# Patient Record
Sex: Male | Born: 1995 | Race: White | Hispanic: No | Marital: Single | Smoking: Never smoker
Health system: Southern US, Community
[De-identification: ages and names within clinical notes are randomized; demographics above are authoritative.]

---
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
## 2018-07-31 ENCOUNTER — Encounter: Payer: Self-pay | Admitting: Sports Medicine

## 2018-08-06 ENCOUNTER — Encounter: Payer: Self-pay | Admitting: Sports Medicine

## 2018-08-30 ENCOUNTER — Encounter: Payer: Self-pay | Admitting: Sports Medicine

## 2018-08-30 ENCOUNTER — Ambulatory Visit (INDEPENDENT_AMBULATORY_CARE_PROVIDER_SITE_OTHER): Payer: No Typology Code available for payment source | Admitting: Sports Medicine

## 2018-08-30 DIAGNOSIS — M25511 Pain in right shoulder: Secondary | ICD-10-CM | POA: Diagnosis not present

## 2018-08-30 DIAGNOSIS — M7551 Bursitis of right shoulder: Secondary | ICD-10-CM | POA: Insufficient documentation

## 2018-08-30 DIAGNOSIS — G8929 Other chronic pain: Secondary | ICD-10-CM

## 2018-08-30 DIAGNOSIS — M7541 Impingement syndrome of right shoulder: Secondary | ICD-10-CM | POA: Diagnosis not present

## 2018-08-30 NOTE — Assessment & Plan Note (Signed)
Greater than 3 months of pain despite conservative measures, negative x-rays, suspect rotator cuff tear. MRI ordered, this will be done on Monday, he can follow-up with me for results. My suspicion is we will do an injection.

## 2018-08-30 NOTE — Progress Notes (Signed)
Subjective:    I'm seeing this patient as a consultation for: Noah Dean  CC: Right shoulder pain  HPI: This is a pleasant 22 year old male, he [facility] [employer], back in August he was doing butterflies, he felt a sharp pain in the front of the shoulder.  [place], x-rays were ordered, and he was treated conservatively as appropriate.  Over the next month he is continued to have pain, localized over the deltoid and worse with overhead activities and abduction.  No mechanical symptoms, no radicular symptoms down to the hand or fingertips.  I reviewed the past medical history, family history, social history, surgical history, and allergies today and no changes were needed.  Please see the problem list section below in epic for further details.  Past Medical History: No past medical history on file. Past Surgical History: No past surgical history on file. Social History: Social History   Socioeconomic History  . Marital status: Single    Spouse name: Not on file  . Number of children: Not on file  . Years of education: Not on file  . Highest education level: Not on file  Occupational History  . Not on file  Social Needs  . Financial resource strain: Not on file  . Food insecurity:    Worry: Not on file    Inability: Not on file  . Transportation needs:    Medical: Not on file    Non-medical: Not on file  Tobacco Use  . Smoking status: Never Smoker  . Smokeless tobacco: Never Used  Substance and Sexual Activity  . Alcohol use: Not on file  . Drug use: Not on file  . Sexual activity: Not on file  Lifestyle  . Physical activity:    Days per week: Not on file    Minutes per session: Not on file  . Stress: Not on file  Relationships  . Social connections:    Talks on phone: Not on file    Gets together: Not on file    Attends religious service: Not on file    Active member of club or organization: Not on file   Attends meetings of clubs or organizations: Not on file    Relationship status: Not on file  Other Topics Concern  . Not on file  Social History Narrative  . Not on file   Family History: Family History  Problem Relation Age of Onset  . Hyperlipidemia Father   . Hypertension Father    Allergies: No Known Allergies Medications: See med rec.  Review of Systems: No headache, visual changes, nausea, vomiting, diarrhea, constipation, dizziness, abdominal pain, skin rash, fevers, chills, night sweats, weight loss, swollen lymph nodes, body aches, joint swelling, muscle aches, chest pain, shortness of breath, mood changes, visual or auditory hallucinations.   Objective:   General: Well Developed, well nourished, and in no acute distress.  Neuro:  Extra-ocular muscles intact, able to move all 4 extremities, sensation grossly intact.  Deep tendon reflexes tested were normal. Psych: Alert and oriented, mood congruent with affect. ENT:  Ears and nose appear unremarkable.  Hearing grossly normal. Neck: Unremarkable overall appearance, trachea midline.  No visible thyroid enlargement. Eyes: Conjunctivae and lids appear unremarkable.  Pupils equal and round. Skin: Warm and dry, no rashes noted.  Cardiovascular: Pulses palpable, no extremity edema. Right shoulder: Inspection reveals no abnormalities, atrophy or asymmetry. Palpation is normal with no tenderness over AC joint or bicipital groove.  ROM is full in all planes. Rotator cuff strength normal throughout. Positive Neer and Hawkin's tests, empty can. Speeds and Yergason's tests normal. No labral pathology noted with negative Obrien's, negative crank, negative clunk, and good stability. Normal scapular function observed. No painful arc and no drop arm sign. No apprehension sign  Impression and Recommendations:   This case required medical decision making of moderate complexity.  Impingement syndrome of right shoulder Greater than 3  months of pain despite conservative measures, negative x-rays, suspect rotator cuff tear. MRI ordered, this will be done on Monday, he can follow-up with me for results. My suspicion is we will do an injection. ___________________________________________ Ihor Austinhomas J. Benjamin Stainhekkekandam, M.D., ABFM., CAQSM. Primary Care and Sports Medicine [facility]  Adjunct Professor of Family Medicine  [employer]

## 2018-09-03 ENCOUNTER — Ambulatory Visit (INDEPENDENT_AMBULATORY_CARE_PROVIDER_SITE_OTHER): Payer: No Typology Code available for payment source

## 2018-09-03 DIAGNOSIS — M7541 Impingement syndrome of right shoulder: Secondary | ICD-10-CM | POA: Diagnosis not present

## 2018-09-03 DIAGNOSIS — G8929 Other chronic pain: Secondary | ICD-10-CM | POA: Diagnosis not present

## 2018-09-03 DIAGNOSIS — M25511 Pain in right shoulder: Secondary | ICD-10-CM | POA: Diagnosis not present

## 2018-09-04 ENCOUNTER — Encounter: Payer: Self-pay | Admitting: Sports Medicine

## 2018-09-04 ENCOUNTER — Ambulatory Visit (INDEPENDENT_AMBULATORY_CARE_PROVIDER_SITE_OTHER): Payer: No Typology Code available for payment source | Admitting: Sports Medicine

## 2018-09-04 DIAGNOSIS — M7551 Bursitis of right shoulder: Secondary | ICD-10-CM | POA: Diagnosis not present

## 2018-09-04 NOTE — Progress Notes (Signed)
Subjective:    CC: MRI results  HPI: This is a pleasant 22 year old male, he had right shoulder pain for some time, MRI results will be dictated below.  I reviewed the past medical history, family history, social history, surgical history, and allergies today and no changes were needed.  Please see the problem list section below in epic for further details.  Past Medical History: No past medical history on file. Past Surgical History: No past surgical history on file. Social History: Social History   Socioeconomic History  . Marital status: Single    Spouse name: Not on file  . Number of children: Not on file  . Years of education: Not on file  . Highest education level: Not on file  Occupational History  . Not on file  Social Needs  . Financial resource strain: Not on file  . Food insecurity:    Worry: Not on file    Inability: Not on file  . Transportation needs:    Medical: Not on file    Non-medical: Not on file  Tobacco Use  . Smoking status: Never Smoker  . Smokeless tobacco: Never Used  Substance and Sexual Activity  . Alcohol use: Not on file  . Drug use: Not on file  . Sexual activity: Not on file  Lifestyle  . Physical activity:    Days per week: Not on file    Minutes per session: Not on file  . Stress: Not on file  Relationships  . Social connections:    Talks on phone: Not on file    Gets together: Not on file    Attends religious service: Not on file    Active member of club or organization: Not on file    Attends meetings of clubs or organizations: Not on file    Relationship status: Not on file  Other Topics Concern  . Not on file  Social History Narrative  . Not on file   Family History: Family History  Problem Relation Age of Onset  . Hyperlipidemia Father   . Hypertension Father    Allergies: No Known Allergies Medications: See med rec.  Review of Systems: No fevers, chills, night sweats, weight loss, chest pain, or shortness of  breath.   Objective:    General: Well Developed, well nourished, and in no acute distress.  Neuro: Alert and oriented x3, extra-ocular muscles intact, sensation grossly intact.  HEENT: Normocephalic, atraumatic, pupils equal round reactive to light, neck supple, no masses, no lymphadenopathy, thyroid nonpalpable.  Skin: Warm and dry, no rashes. Cardiac: Regular rate and rhythm, no murmurs rubs or gallops, no lower extremity edema.  Respiratory: Clear to auscultation bilaterally. Not using accessory muscles, speaking in full sentences.  MRI shows subcoracoid bursitis.  No rotator cuff tears.  Impression and Recommendations:    Subcoracoid bursitis of right shoulder We will start with formal PT, 2 weeks here and then he will [facility]. If no improvement after 4 to 6 weeks we will proceed with a subcoracoid injection. She will focus extensively on adequate form in the gym.  I spent 25 minutes with this patient, greater than 50% was face-to-face time counseling regarding the above diagnoses, specifically discussing imaging findings, as well as biomechanics and anatomy of rotator cuff dysfunction and subcoracoid bursitis. ___________________________________________ Ihor Austinhomas J. Benjamin Stainhekkekandam, M.D., ABFM., CAQSM. Primary Care and Sports Medicine [facility]  Adjunct Professor of Family Medicine  [employer]

## 2018-09-04 NOTE — Assessment & Plan Note (Signed)
We will start with formal PT, 2 weeks here and then he will [facility]. If no improvement after 4 to 6 weeks we will proceed with a subcoracoid injection. She will focus extensively on adequate form in the gym.

## 2018-12-12 ENCOUNTER — Ambulatory Visit (HOSPITAL_COMMUNITY): Payer: Self-pay | Admitting: Psychiatry

## 2019-07-19 IMAGING — MR MR SHOULDER*R* W/O CM
5 series · 40 of 40 positions shown · non-contrast
Comparison: None.

CLINICAL DATA: Right shoulder pain and weakness with painful range
of motion since a lifting injury 5 months ago.

EXAM:
MRI OF THE RIGHT SHOULDER WITHOUT CONTRAST
TECHNIQUE: Multiplanar, multisequence MR imaging of the shoulder was performed.
No intravenous contrast was administered.

[Series 3: PD fat-sat · axial · 4.0mm · 0.59mm/px · z∈[-13,+80]mm · 8 of 22 slices shown (1 of 2)]
[im 1/22]
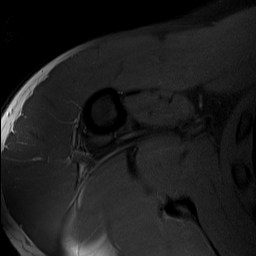
[im 4/22]
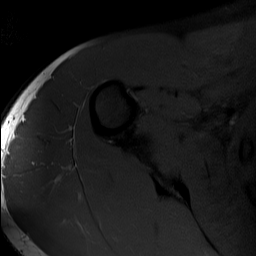
[im 7/22]
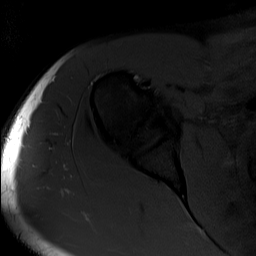
[im 10/22]
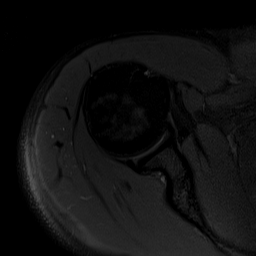
[im 13/22]
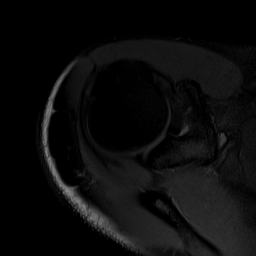
[im 16/22]
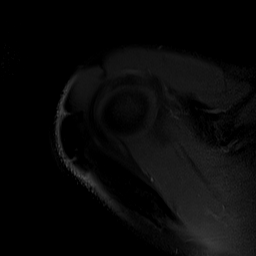
[im 19/22]
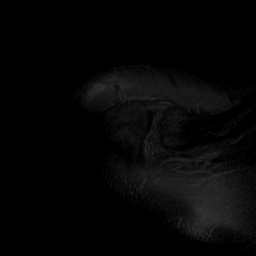
[im 22/22]
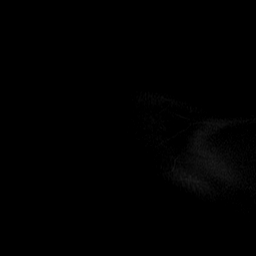

[Series 4: T2 fat-sat · oblique · 4.0mm · 0.59mm/px · 8 of 20 slices shown (1 of 2)]
[im 1/20]
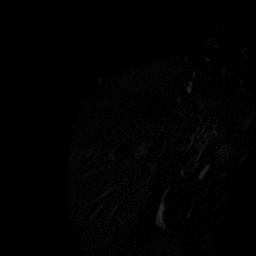
[im 3/20]
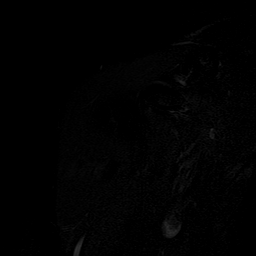
[im 6/20]
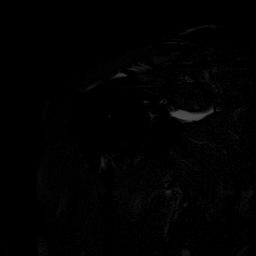
[im 9/20]
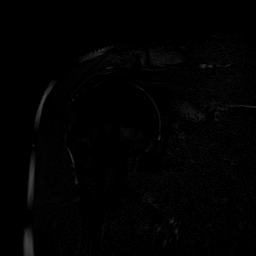
[im 11/20]
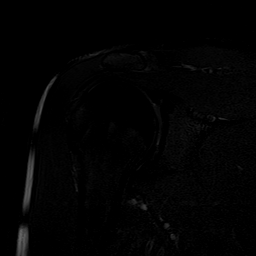
[im 14/20]
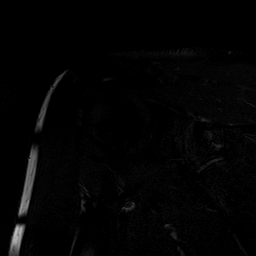
[im 17/20]
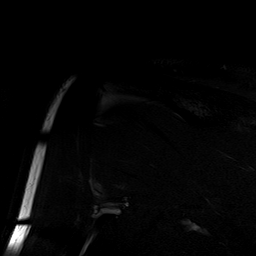
[im 20/20]
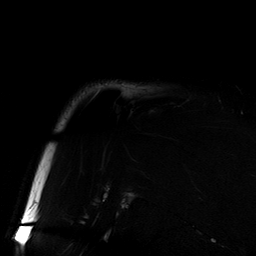

[Series 5: PD fat-sat · oblique · 4.0mm · 0.59mm/px · 8 of 20 slices shown (2 of 2)]
[im 1/20]
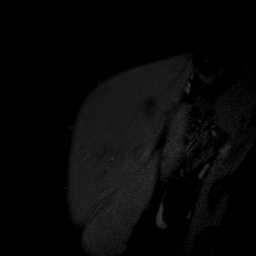
[im 3/20]
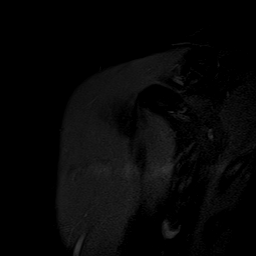
[im 6/20]
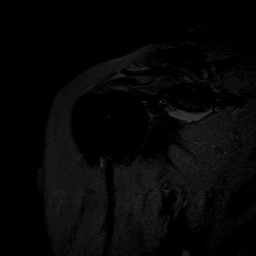
[im 9/20]
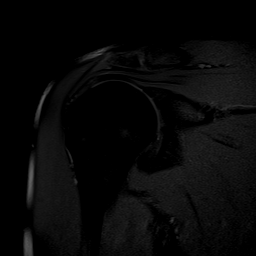
[im 11/20]
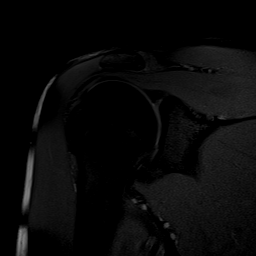
[im 14/20]
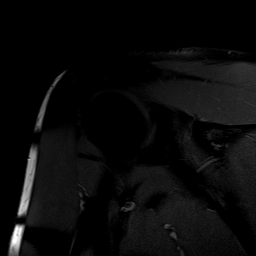
[im 17/20]
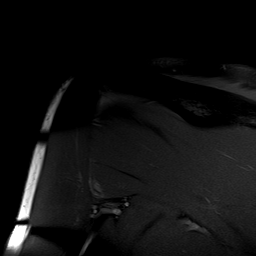
[im 20/20]
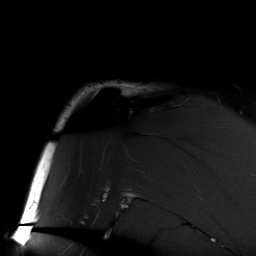

[Series 6: T1 · oblique · 4.0mm · 0.59mm/px · 8 of 22 slices shown]
[im 1/22]
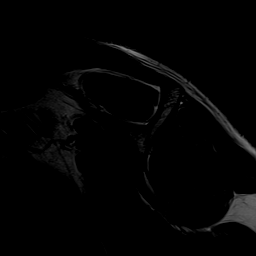
[im 4/22]
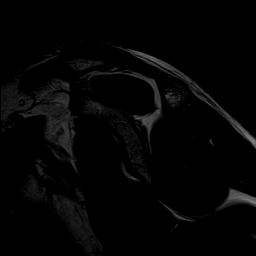
[im 7/22]
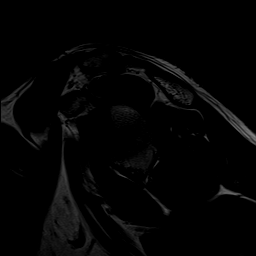
[im 10/22]
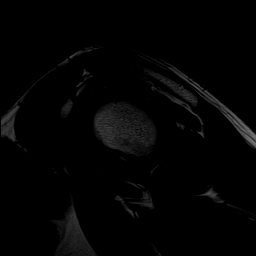
[im 13/22]
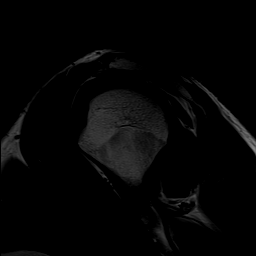
[im 16/22]
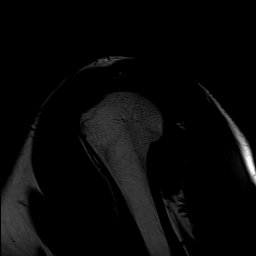
[im 19/22]
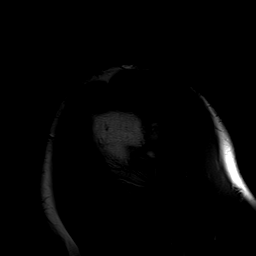
[im 22/22]
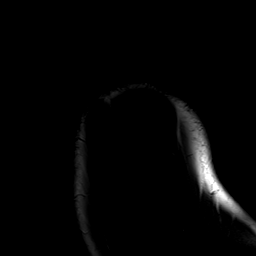

[Series 7: T2 fat-sat · oblique · 4.0mm · 0.59mm/px · 8 of 22 slices shown (2 of 2)]
[im 1/22]
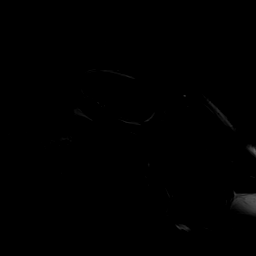
[im 4/22]
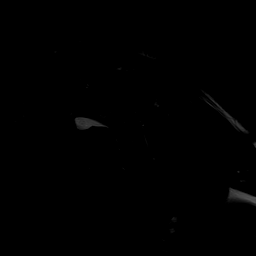
[im 7/22]
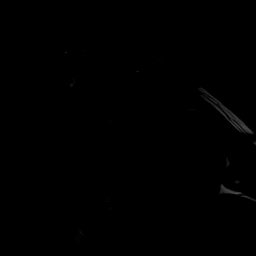
[im 10/22]
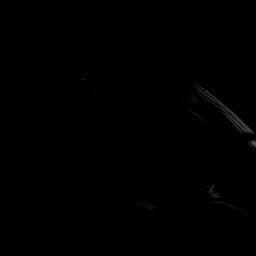
[im 13/22]
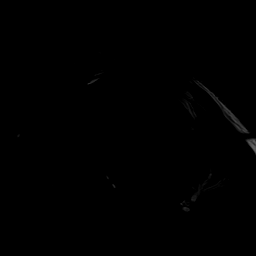
[im 16/22]
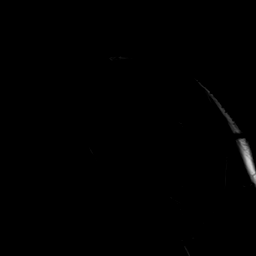
[im 19/22]
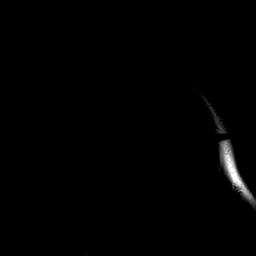
[im 22/22]
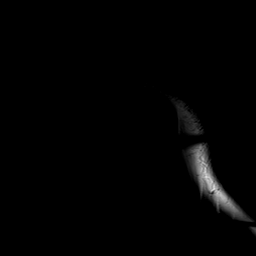

[40 of 40 positions shown; findings below may reference images not displayed]

FINDINGS: Rotator cuff: Intact supraspinatus, infraspinatus, teres minor and
subscapularis tendons.

Muscles: No atrophy or abnormal signal of the muscles of the rotator
cuff.

Biceps long head:  Properly located and intact.

Acromioclavicular Joint:  Normal.  Type 2 acromion. No bursitis

Glenohumeral Joint: Tiny effusion in the subcoracoid recess.
Glenohumeral joint is otherwise normal. No chondral defect.

Labrum:  Normal.

Bones:  Normal.

Other: None
IMPRESSION: No significant abnormality of the right shoulder.
